# Patient Record
Sex: Female | Born: 1960 | Race: Black or African American | Hispanic: No | Marital: Married | Smoking: Current every day smoker
Health system: Southern US, Community
[De-identification: ages and names within clinical notes are randomized; demographics above are authoritative.]

## PROBLEM LIST (undated history)

## (undated) DIAGNOSIS — I219 Acute myocardial infarction, unspecified: Secondary | ICD-10-CM

## (undated) DIAGNOSIS — D649 Anemia, unspecified: Secondary | ICD-10-CM

---

## 2005-08-28 ENCOUNTER — Emergency Department: Payer: Self-pay | Admitting: Emergency Medicine

## 2007-12-03 ENCOUNTER — Emergency Department: Payer: Self-pay | Admitting: Emergency Medicine

## 2013-01-18 ENCOUNTER — Observation Stay: Payer: Self-pay | Admitting: Internal Medicine

## 2013-01-18 LAB — CBC
HCT: 16.6 % — ABNORMAL LOW (ref 35.0–47.0)
HGB: 4.4 g/dL — CL (ref 12.0–16.0)
HGB: 4.3 g/dL — CL (ref 12.0–16.0)
MCH: 14.7 pg — ABNORMAL LOW (ref 26.0–34.0)
MCH: 14.6 pg — ABNORMAL LOW (ref 26.0–34.0)
MCHC: 26.2 g/dL — ABNORMAL LOW (ref 32.0–36.0)
MCHC: 25.7 g/dL — ABNORMAL LOW (ref 32.0–36.0)
MCV: 57 fL — ABNORMAL LOW (ref 80–100)
RBC: 3 10*6/uL — ABNORMAL LOW (ref 3.80–5.20)
RBC: 2.92 10*6/uL — ABNORMAL LOW (ref 3.80–5.20)
WBC: 3.9 10*3/uL (ref 3.6–11.0)

## 2013-01-18 LAB — COMPREHENSIVE METABOLIC PANEL
Albumin: 3.4 g/dL (ref 3.4–5.0)
Alkaline Phosphatase: 65 U/L (ref 50–136)
Anion Gap: 5 — ABNORMAL LOW (ref 7–16)
BUN: 8 mg/dL (ref 7–18)
Calcium, Total: 8.4 mg/dL — ABNORMAL LOW (ref 8.5–10.1)
Chloride: 115 mmol/L — ABNORMAL HIGH (ref 98–107)
Creatinine: 0.55 mg/dL — ABNORMAL LOW (ref 0.60–1.30)
EGFR (Non-African Amer.): >60
Osmolality: 279 (ref 275–301)
Potassium: 3.3 mmol/L — ABNORMAL LOW (ref 3.5–5.1)
SGOT(AST): 18 U/L (ref 15–37)
SGPT (ALT): 9 U/L — ABNORMAL LOW (ref 12–78)
Sodium: 141 mmol/L (ref 136–145)

## 2013-01-18 LAB — HEMOGLOBIN: HGB: 7 g/dL — ABNORMAL LOW (ref 12.0–16.0)

## 2013-01-18 LAB — TROPONIN I: Troponin-I: <0.02 ng/mL

## 2014-03-25 IMAGING — CR DG CHEST 2V
1 series · 2 of 2 positions shown · non-contrast
Comparison: none

REASON FOR EXAM: SOB
COMMENTS:

[Series 1: pa · 0.17mm/px · 2 of 2 slices shown]
[im 1/2]
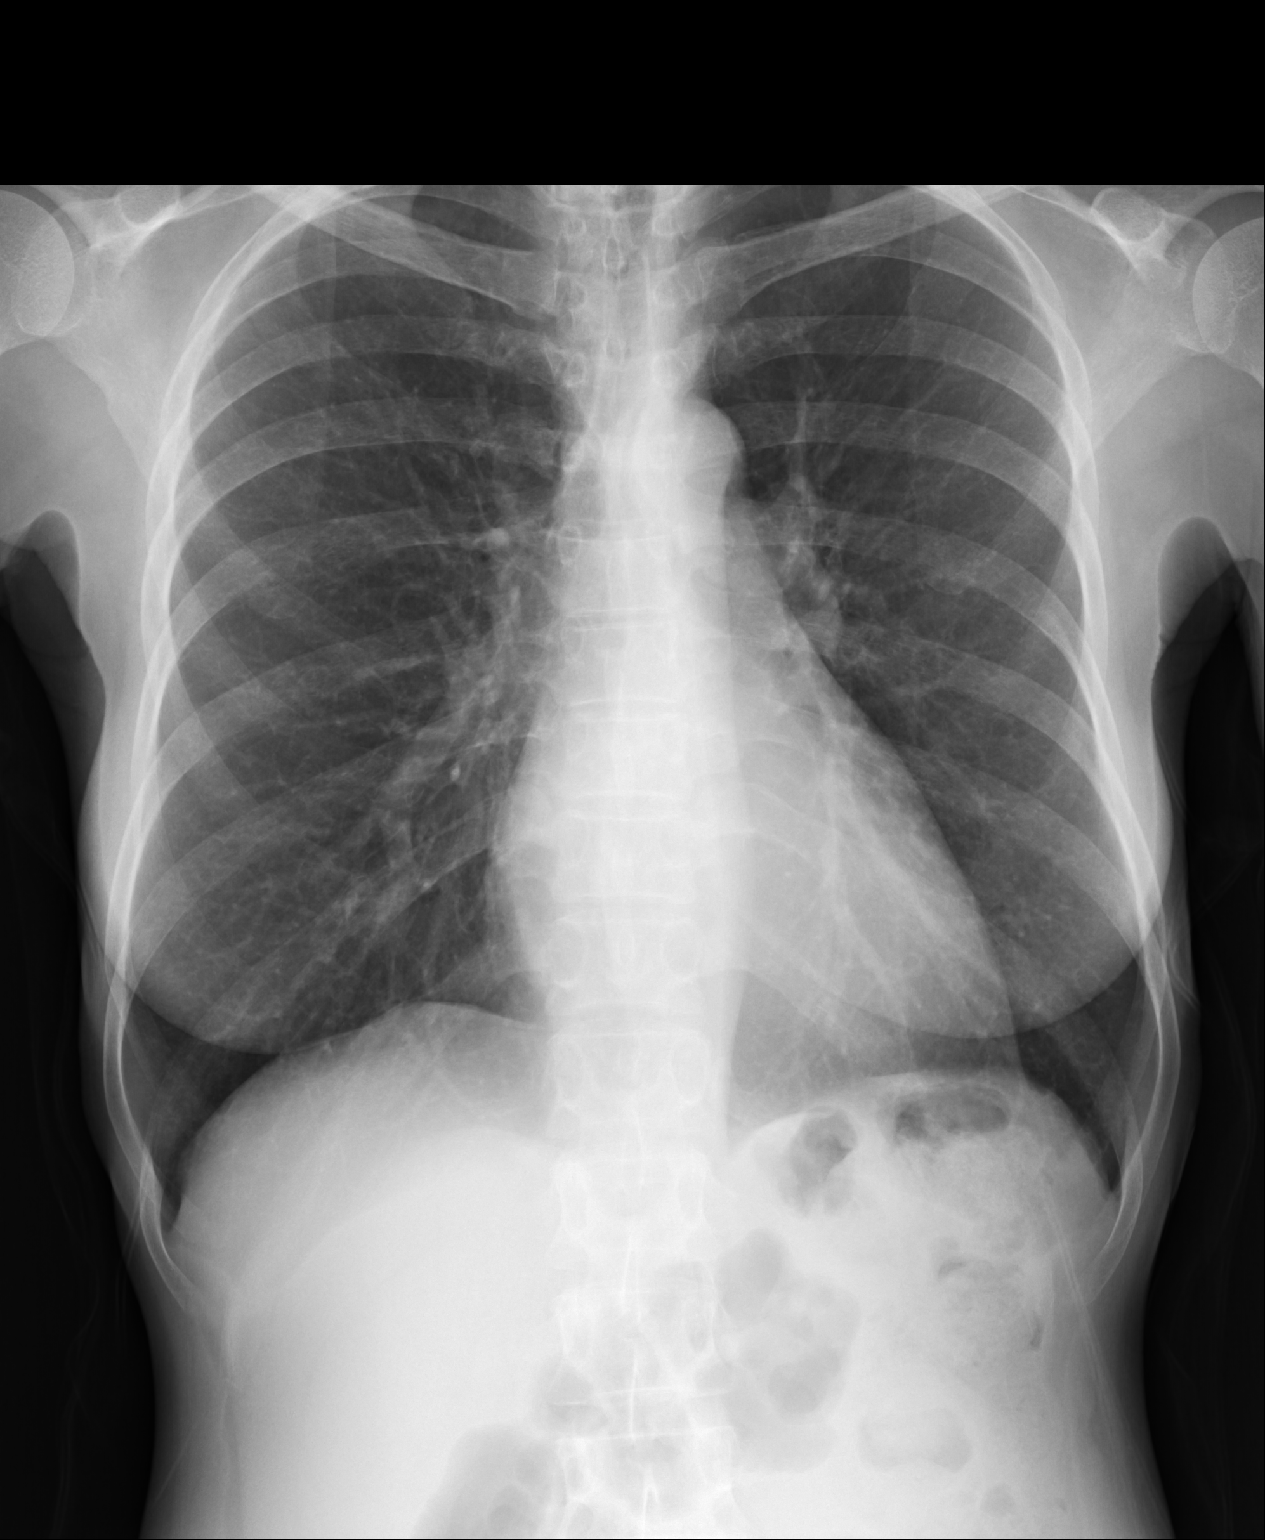
[im 2/2]
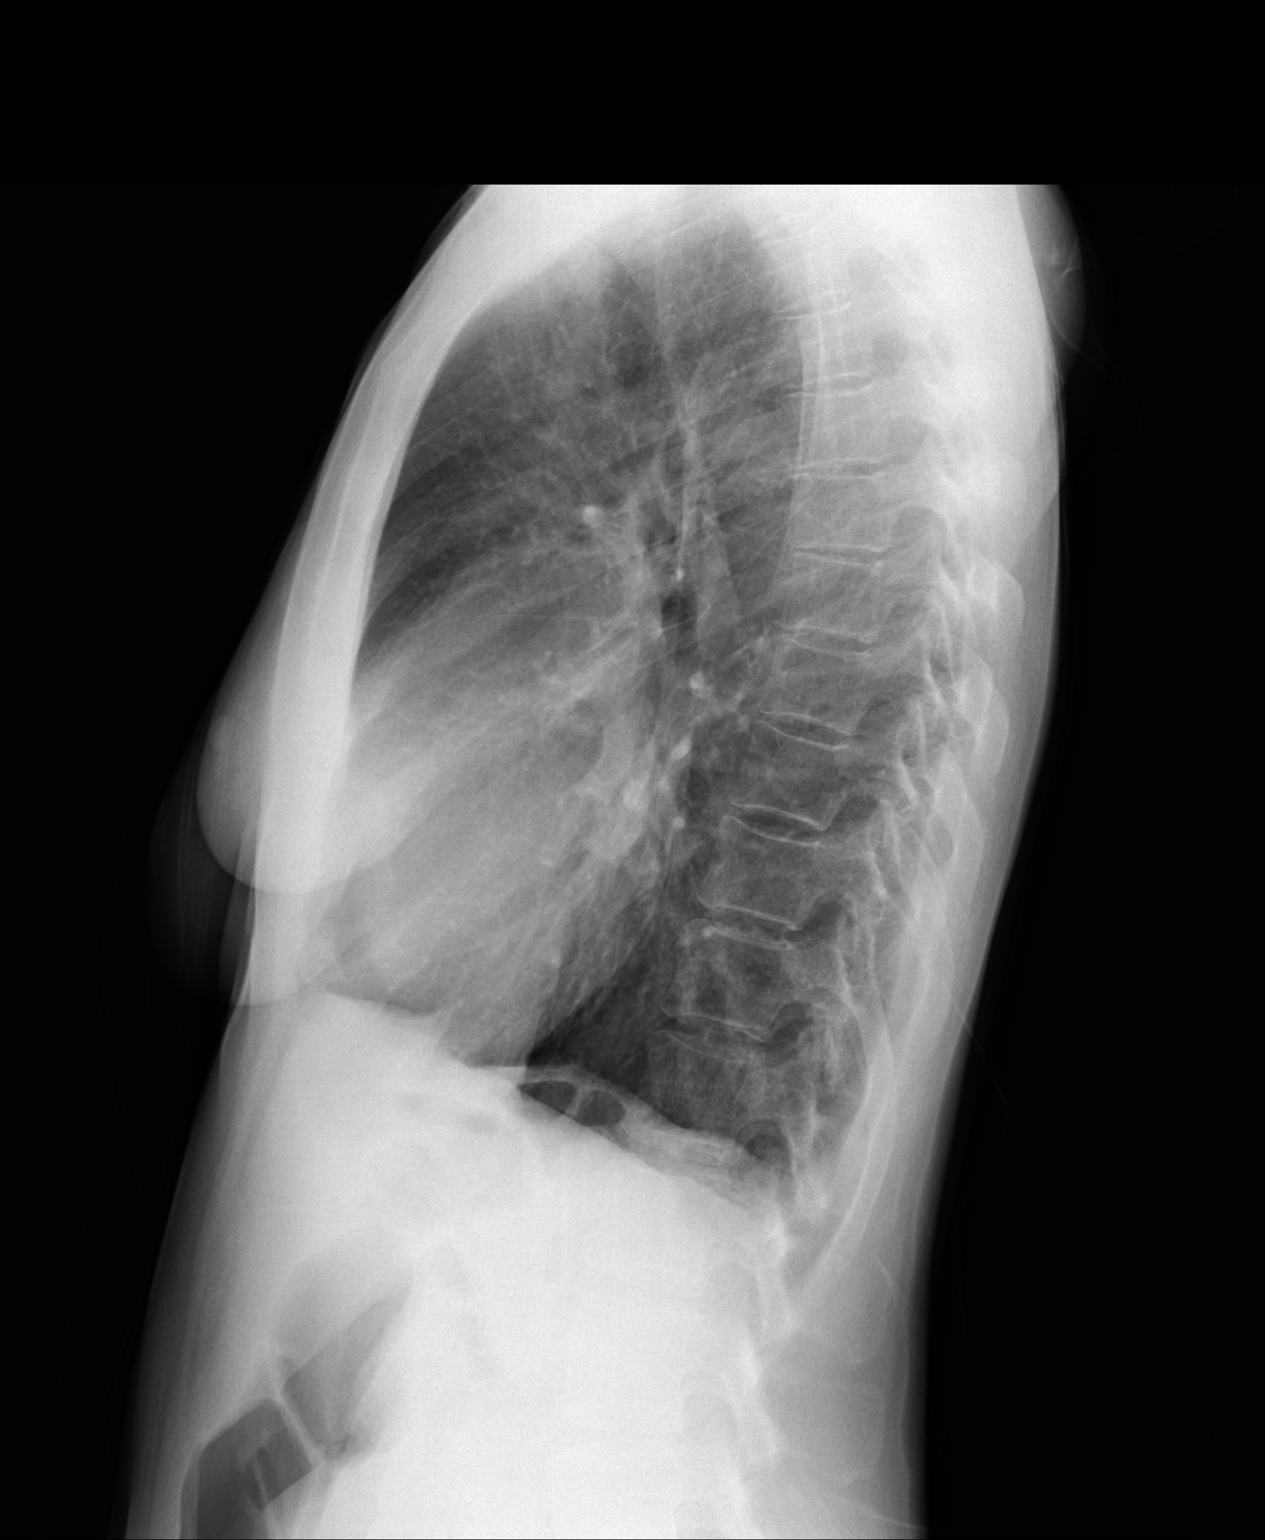

[2 of 2 positions shown; findings below may reference images not displayed]

PROCEDURE:     DXR - DXR CHEST PA (OR AP) AND LATERAL  - January 18, 2013  [DATE]

RESULT:     The lungs are mildly hyperinflated with mild hemidiaphragm
flattening. The cardiac silhouette is normal in size. The pulmonary
vascularity is not engorged. There is no pleural effusion or pneumothorax.
The mediastinum is normal in width. There is no mediastinal nor hilar
lymphadenopathy. The bony thorax is normal in appearance.
IMPRESSION: The findings suggest reactive airway disease or COPD. There
is no evidence of pneumonia nor CHF.

[REDACTED]

## 2014-08-29 NOTE — H&P (Signed)
PATIENT NAME:  Catherine Silva, [name] MR#:  [id number] DATE OF BIRTH:  1960/06/01  DATE OF ADMISSION:  01/18/2013  PRIMARY CARE PHYSICIAN: None. The patient [facility] to [facility], but she says she cannot afford to go there.   CHIEF COMPLAINT: Leg swelling.   HISTORY OF PRESENT ILLNESS: Catherine Silva is a 54 year old female with history of iron deficiency anemia and chronic back pain, who was on iron pills about a year ago, stopped taking it since her medications expired. She did not buy any over-the-counter iron pills and has been off iron since September 2013. She comes in because of leg swelling. She was found to have hemoglobin of 4 and is being admitted for further evaluation and management.   The patient states she was [facility] and they did colon[facility]. She does not know the results, but she states that everything looked okay. She does not remember getting an EGD. She denies taking any over-the-counter pain medicines including NSAIDS. She also denies any heavy menstrual cycles. Her last menstrual period was about two months ago.   PAST MEDICAL HISTORY: 1. Iron deficiency anemia with [facility]  2. Chronic back pain.   MEDICATIONS: None.   ALLERGIES: No known drug allergies.   REVIEW OF SYSTEMS: CONSTITUTIONAL: No fever, fatigue, weakness. EYES: No blurred or double vision or glaucoma.  ENT: No tinnitus, ear pain, hearing loss.  RESPIRATORY: No cough, wheeze, hemoptysis or dyspnea.  CARDIOVASCULAR: No chest pain, orthopnea. Positive for leg edema. No hypertension or palpitations.  GASTROINTESTINAL: No nausea, vomiting, diarrhea, melena, or GERD.   GENITOURINARY: No dysuria, hematuria.  ENDOCRINE: No polyuria or nocturia or thyroid problems.  HEMATOLOGY: No anemia or easy bruising.  SKIN: No acne or rash.  MUSCULOSKELETAL: No arthritis.  NEUROLOGIC: No CVA, TIA, dysarthria or vertigo.  PSYCHIATRIC: No  anxiety or depression. All other systems reviewed and negative.   PHYSICAL EXAMINATION: GENERAL: The patient is awake, alert, oriented x3, not in acute distress.  VITAL SIGNS: Afebrile, pulse is 105, respirations are 20 and blood pressure is 119/62.  HEENT: Atraumatic, normocephalic. PERRLA, EOM intact. Oral mucosa is moist.  Conjunctiva pallor present.  NECK: Supple. No JVD. No carotid bruit.  RESPIRATORY: Clear to auscultation bilaterally. No rales, rhonchi, respiratory distress or labored breathing.  HEART: Both the heart sounds are normal. Rate, rhythm regular. PMI is not lateralized. CHEST:  Nontender.  EXTREMITIES: Good pedal pulses, good femoral pulses 1+ pitting edema.  ABDOMINOUS: Soft, benign, nontender. No organomegaly.  NEUROLOGICAL:  Grossly intact cranial nerves II-XII. No motor or sensory deficit.  PSYCHIATRIC: The patient is awake, alert, oriented x3.  SKIN: Warm and dry.   CHEST X-RAY: Positive for COPD. H and H is 4.3 and 16.6, MCV is 57. White count is 3.9.  Creatinine 0.55, sodium is 141, potassium is 3.3, chloride is 115, bicarbonate is 21. Bilirubin is 0.1.   ASSESSMENT AND PLAN: A 54 year old [name] Catherine Silva comes in with leg swelling. was found to have hemoglobin of 4.4. She does have history of chronic iron deficiency anemia. She is being admitted for:  1. Acute on chronic iron deficiency anemia. The patient has [facility] the past. [facility] In the meantime, we will type and cross and transfuse 2 units of blood transfusion. The patient is agreeable, consented, risk and benefits of transfusion explained. The patient voiced understanding. We will check  hemoglobin after  transfusion is completed. The patient will be started on iron pills, of which she is recommended to buy over-the-counter. She is also recommended to follow up with hematology. The patient has financial constraints. She says she is not able to afford appointments  and will likely not be able to make it to hematology appointment. I did stress on the importance of her taking iron pills.  2. Chronic back pain. P.r.n. Tylenol as needed.  3. Deep venous thrombosis prophylaxis. The patient is ambulatory.  4. Record request has been sent to [facility]. We will review the records and we will order necessary tests after reviewing records.   TIME SPENT: 50 minutes.    ____________________________ Wylie Hail Allena Katz, MD sap:sg D: 01/18/2013 12:23:19 ET T: 01/18/2013 18:25:37 ET JOB#: [id number]  cc: Dorleen Kissel A. Allena Katz, MD, <Dictator> Willow Ora MD ELECTRONICALLY SIGNED 01/22/2013 8:06

## 2014-08-29 NOTE — Discharge Summary (Signed)
PATIENT NAME:  [name], Catherine A MR#:  [id number] DATE OF BIRTH:  1961-04-01  DATE OF ADMISSION:  01/18/2013 DATE OF DISCHARGE:  01/18/2013  PRIMARY CARE PHYSICIAN:  None.   CHIEF COMPLAINT:  Leg swelling.   DISCHARGE DIAGNOSES: 1.  Acute on chronic iron deficiency anemia, status post 2 units blood transfusion.  2.  Chronic iron deficiency anemia, suspect due to slow gastrointestinal bleed from chronic BC Powder use, possible gastritis.  The patient reluctant to get any gastroenterology work-up.  3.  Tobacco abuse.  4.  Back pain.   CODE STATUS:  FULL CODE.   MEDICATIONS:  Ferrous sulfate 325 mg by mouth twice daily.    The patient advised not to use BC powders.  The patient advised to stop smoking.   LABORATORY DATA:  Hemoglobin at discharge is 7.0.  Hemoglobin on admission is 4.3.  Hematocrit is 16.6, platelet count is 47.  White count is 3.3.  Chest x-ray is findings suggestive of reactive airway disease/COPD.     BRIEF SUMMARY OF HOSPITAL COURSE:  Catherine Silva is a 54 year old female with past medical history of iron deficiency anemia comes into the Emergency Room with increasing leg swelling.  She is admitted with:  1.  Acute on chronic iron deficiency anemia.  She came in with hemoglobin of 4.3.  The patient was admitted, got 2 units of blood transfusion.  She had a repeat hemoglobin is 7.0.  The patient has history of using BC Powders for back pain on a daily basis.  This could be slow GI bleed, rule out gastritis or peptic ulcer disease.  The patient was advised to stop BC Powders.  She was also recommended to get a GI work-up done while she is here, however she is reluctant and does not want to get any work-up done.  [facility] records were obtained from about a year ago.  She was admitted at the same time with similar presentation with low hemoglobin of 5.6.  She received 2 units of blood transfusion.  She was set up as outpatient to follow-up GI, however patient never followed up.  She  has known history of noncompliance.  2.  Tobacco abuse.  The patient was counseled on smoking cessation, about three minutes were spent.  3.  Chronic back pain.  As needed Tylenol was recommended to use.   Hospital stay otherwise remained stable.  The patient remained a FULL CODE.   TIME SPENT:  40 minutes.    ____________________________ Wylie HailSona A. Allena KatzPatel, MD sap:ea D: 01/18/2013 20:05:06 ET T: 01/19/2013 05:15:24 ET JOB#: [id number]  cc: Jack Mineau A. Allena KatzPatel, MD, <Dictator> Willow OraSONA A Tekisha Darcey MD ELECTRONICALLY SIGNED 01/22/2013 8:06

## 2015-01-11 ENCOUNTER — Emergency Department: Payer: Self-pay

## 2015-01-11 ENCOUNTER — Encounter: Payer: Self-pay | Admitting: *Deleted

## 2015-01-11 DIAGNOSIS — Z7982 Long term (current) use of aspirin: Secondary | ICD-10-CM | POA: Insufficient documentation

## 2015-01-11 DIAGNOSIS — Y998 Other external cause status: Secondary | ICD-10-CM | POA: Insufficient documentation

## 2015-01-11 DIAGNOSIS — Y9289 Other specified places as the place of occurrence of the external cause: Secondary | ICD-10-CM | POA: Insufficient documentation

## 2015-01-11 DIAGNOSIS — W1839XA Other fall on same level, initial encounter: Secondary | ICD-10-CM | POA: Insufficient documentation

## 2015-01-11 DIAGNOSIS — Z720 Tobacco use: Secondary | ICD-10-CM | POA: Insufficient documentation

## 2015-01-11 DIAGNOSIS — Y9389 Activity, other specified: Secondary | ICD-10-CM | POA: Insufficient documentation

## 2015-01-11 DIAGNOSIS — S20211A Contusion of right front wall of thorax, initial encounter: Secondary | ICD-10-CM | POA: Insufficient documentation

## 2015-01-11 LAB — CBC
HCT: 41.3 % (ref 35.0–47.0)
HEMOGLOBIN: 13.3 g/dL (ref 12.0–16.0)
MCH: 24.7 pg — AB (ref 26.0–34.0)
MCHC: 32.2 g/dL (ref 32.0–36.0)
MCV: 76.7 fL — ABNORMAL LOW (ref 80.0–100.0)
PLATELETS: 391 10*3/uL (ref 150–440)
RBC: 5.38 MIL/uL — AB (ref 3.80–5.20)
RDW: 19.6 % — ABNORMAL HIGH (ref 11.5–14.5)
WBC: 6.5 10*3/uL (ref 3.6–11.0)

## 2015-01-11 NOTE — ED Notes (Signed)
Pt c/o anterior R rib pain since falling on Monday onto her R side. Pt states worsening over time. Pain reproducible w/ palpation.

## 2015-01-12 ENCOUNTER — Emergency Department
Admission: EM | Admit: 2015-01-12 | Discharge: 2015-01-12 | Disposition: A | Discharge status: Home / Self Care | Payer: Self-pay | Attending: Emergency Medicine | Admitting: Emergency Medicine

## 2015-01-12 DIAGNOSIS — R0789 Other chest pain: Secondary | ICD-10-CM

## 2015-01-12 DIAGNOSIS — S20211A Contusion of right front wall of thorax, initial encounter: Secondary | ICD-10-CM

## 2015-01-12 DIAGNOSIS — R079 Chest pain, unspecified: Secondary | ICD-10-CM

## 2015-01-12 HISTORY — DX: Anemia, unspecified: D64.9

## 2015-01-12 HISTORY — DX: Acute myocardial infarction, unspecified: I21.9

## 2015-01-12 LAB — TROPONIN I

## 2015-01-12 LAB — BASIC METABOLIC PANEL
ANION GAP: 12 (ref 5–15)
BUN: 10 mg/dL (ref 6–20)
CALCIUM: 9.1 mg/dL (ref 8.9–10.3)
CHLORIDE: 109 mmol/L (ref 101–111)
CO2: 18 mmol/L — ABNORMAL LOW (ref 22–32)
CREATININE: 0.67 mg/dL (ref 0.44–1.00)
GFR calc non Af Amer: >60 mL/min (ref >60)
Glucose, Bld: 83 mg/dL (ref 65–99)
Potassium: 3.7 mmol/L (ref 3.5–5.1)
SODIUM: 139 mmol/L (ref 135–145)

## 2015-01-12 MED ORDER — HYDROCODONE-ACETAMINOPHEN 5-325 MG PO TABS: 1.0000 | ORAL_TABLET | Freq: Four times a day (QID) | ORAL | Status: AC | PRN

## 2015-01-12 MED ORDER — HYDROCODONE-ACETAMINOPHEN 5-325 MG PO TABS
1.0000 | ORAL_TABLET | Freq: Once | ORAL | Status: AC
  Administered 2015-01-12: 1 via ORAL
  Filled 2015-01-12: qty 1

## 2015-01-12 MED ORDER — IBUPROFEN 600 MG PO TABS
600.0000 mg | ORAL_TABLET | Freq: Once | ORAL | Status: AC
  Administered 2015-01-12: 600 mg via ORAL
  Filled 2015-01-12: qty 1

## 2015-01-12 MED ORDER — IBUPROFEN 600 MG PO TABS: 600.0000 mg | ORAL_TABLET | Freq: Three times a day (TID) | ORAL | Status: AC | PRN

## 2015-01-12 NOTE — Discharge Instructions (Signed)
1. Take pain medicines as needed (Motrin/Norco #15). 2. Return to the ED for worsening symptoms, fever, persistent vomiting, difficulty breathing or other concerns.  Chest Wall Pain Chest wall pain is pain in or around the bones and muscles of your chest. It may take up to 6 weeks to get better. It may take longer if you must stay physically active in your work and activities.  CAUSES  Chest wall pain may happen on its own. However, it may be caused by:  A viral illness like the flu.  Injury.  Coughing.  Exercise.  Arthritis.  Fibromyalgia.  Shingles. HOME CARE INSTRUCTIONS   Avoid overtiring physical activity. Try not to strain or perform activities that cause pain. This includes any activities using your chest or your abdominal and side muscles, especially if heavy weights are used.  Put ice on the sore area.  Put ice in a plastic bag.  Place a towel between your skin and the bag.  Leave the ice on for 15-20 minutes per hour while awake for the first 2 days.  Only take over-the-counter or prescription medicines for pain, discomfort, or fever as directed by your caregiver. SEEK IMMEDIATE MEDICAL CARE IF:   Your pain increases, or you are very uncomfortable.  You have a fever.  Your chest pain becomes worse.  You have new, unexplained symptoms.  You have nausea or vomiting.  You feel sweaty or lightheaded.  You have a cough with phlegm (sputum), or you cough up blood. MAKE SURE YOU:   Understand these instructions.  Will watch your condition.  Will get help right away if you are not doing well or get worse. Document Released: 04/25/2005 Document Revised: 07/18/2011 Document Reviewed: 12/20/2010 [facility] Patient Information 2015 [place], [place]. This information is not intended to replace advice given to you by your health care provider. Make sure you discuss any questions you have with your health care provider.  Rib Contusion A rib contusion (bruise) can  occur by a blow to the chest or by a fall against a hard object. Usually these will be much better in a couple weeks. If X-rays were taken today and there are no broken bones (fractures), the diagnosis of bruising is made. However, broken ribs may not show up for several days, or may be discovered later on a routine X-ray when signs of healing show up. If this happens to you, it does not mean that something was missed on the X-ray, but simply that it did not show up on the first X-rays. Earlier diagnosis will not usually change the treatment. HOME CARE INSTRUCTIONS   Avoid strenuous activity. Be careful during activities and avoid bumping the injured ribs. Activities that pull on the injured ribs and cause pain should be avoided, if possible.  For the first day or two, an ice pack used every 20 minutes while awake may be helpful. Put ice in a plastic bag and put a towel between the bag and the skin.  Eat a normal, well-balanced diet. Drink plenty of fluids to avoid constipation.  Take deep breaths several times a day to keep lungs free of infection. Try to cough several times a day. Splint the injured area with a pillow while coughing to ease pain. Coughing can help prevent pneumonia.  Wear a rib belt or binder only if told to do so by your caregiver. If you are wearing a rib belt or binder, you must do the breathing exercises as directed by your caregiver. If not used  properly, rib belts or binders restrict breathing which can lead to pneumonia.  Only take over-the-counter or prescription medicines for pain, discomfort, or fever as directed by your caregiver. SEEK MEDICAL CARE IF:   You or your child has an oral temperature above 102 F (38.9 C).  Your baby is older than 3 months with a rectal temperature of 100.5 F (38.1 C) or higher for more than 1 day.  You develop a cough, with thick or bloody sputum. SEEK IMMEDIATE MEDICAL CARE IF:   You have difficulty breathing.  You feel sick to  your stomach (nausea), have vomiting or belly (abdominal) pain.  You have worsening pain, not controlled with medications, or there is a change in the location of the pain.  You develop sweating or radiation of the pain into the arms, jaw or shoulders, or become light headed or faint.  You or your child has an oral temperature above 102 F (38.9 C), not controlled by medicine.  Your or your baby is older than 3 months with a rectal temperature of 102 F (38.9 C) or higher.  Your baby is 62 months old or younger with a rectal temperature of 100.4 F (38 C) or higher. MAKE SURE YOU:   Understand these instructions.  Will watch your condition.  Will get help right away if you are not doing well or get worse. Document Released: 01/18/2001 Document Revised: 08/20/2012 Document Reviewed: 12/12/2007 [facility] Patient Information 2015 [place], [place]. This information is not intended to replace advice given to you by your health care provider. Make sure you discuss any questions you have with your health care provider.

## 2015-01-12 NOTE — ED Notes (Signed)
Patient states that she was walking up her stairs and fell on her right side/flank approximately 2 weeks prior to the visit. Patient states that pain keep her from sleeping, and only hurts when she moves or coughs.

## 2015-01-12 NOTE — ED Provider Notes (Signed)
[facility] Emergency Department Provider Note  ____________________________________________  Time seen: Approximately 4:27 AM  I have reviewed the triage vital signs and the nursing notes.   HISTORY  Chief Complaint Chest Pain    HPI Catherine Silva is a 54 y.o. female who presents to the ED from home with a chief complaint of right rib pain. Patient had a mechanical fall approximately 2 weeks ago and struck her right anterior ribs on concrete. Complains of pain that keeps her from sleeping and is exacerbated by movement or coughing. Patient denies fever, chills, shortness of breath, abdominal pain, nausea, vomiting, diarrhea. She did not strike her head or experience LOC when she fell. Presents to the ED this evening for being unable to sleep secondary to discomfort.   Past Medical History  Diagnosis Date  . Myocardial infarction   . Anemia     There are no active problems to display for this patient.   History reviewed. No pertinent past surgical history.  Current Outpatient Rx  Name  Route  Sig  Dispense  Refill  . aspirin 81 MG tablet   Oral   Take 81 mg by mouth daily.           Allergies Review of patient's allergies indicates no known allergies.  History reviewed. No pertinent family history.  Social History Social History  Substance Use Topics  . Smoking status: Current Every Day Smoker    Types: Cigarettes  . Smokeless tobacco: Never Used  . Alcohol Use: Yes     Comment: every other weekend    Review of Systems Constitutional: No fever/chills Eyes: No visual changes. ENT: No sore throat. Cardiovascular: Positive for chest pain. Respiratory: Denies shortness of breath. Gastrointestinal: No abdominal pain.  No nausea, no vomiting.  No diarrhea.  No constipation. Genitourinary: Negative for dysuria. Musculoskeletal: Negative for back pain. Skin: Negative for rash. Neurological: Negative for headaches, focal weakness or  numbness.  10-point ROS otherwise negative.  ____________________________________________   PHYSICAL EXAM:  VITAL SIGNS: ED Triage Vitals  Enc Vitals Group     BP 01/11/15 2307 144/99 mmHg     Pulse Rate 01/11/15 2307 86     Resp 01/11/15 2307 20     Temp 01/11/15 2307 98.1 F (36.7 C)     Temp Source 01/11/15 2307 Oral     SpO2 01/11/15 2307 100 %     Weight 01/11/15 2307 112 lb 9.6 oz (51.075 kg)     Height 01/11/15 2307  (1.702 m)     Head Cir --      Peak Flow --      Pain Score 01/12/15 0341 9     Pain Loc --      Pain Edu? --      Excl. in GC? --     Constitutional: Alert and oriented. Well appearing and in no acute distress. Eyes: Conjunctivae are normal. PERRL. EOMI. Head: Atraumatic. Nose: No congestion/rhinnorhea. Mouth/Throat: Mucous membranes are moist.  Oropharynx non-erythematous. Neck: No stridor. No cervical spine tenderness to palpation. Cardiovascular: Normal rate, regular rhythm. Grossly normal heart sounds.  Good peripheral circulation. Respiratory: Normal respiratory effort.  No retractions. Lungs CTAB. Right anterior and lateral ribs beneath breast mildly tender to palpation. There is no ecchymosis. Gastrointestinal: Soft and nontender. Specifically, no tenderness to palpation in the right upper quadrant. No distention. No abdominal bruits. No CVA tenderness. Musculoskeletal: No lower extremity tenderness nor edema.  No joint effusions. Neurologic:  Normal speech  and language. No gross focal neurologic deficits are appreciated. No gait instability. Skin:  Skin is warm, dry and intact. No rash noted. Psychiatric: Mood and affect are normal. Speech and behavior are normal.  ____________________________________________   LABS (all labs ordered are listed, but only abnormal results are displayed)  Labs Reviewed  BASIC METABOLIC PANEL - Abnormal; Notable for the following:    CO2 18 (*)    All other components within normal limits  CBC -  Abnormal; Notable for the following:    RBC 5.38 (*)    MCV 76.7 (*)    MCH 24.7 (*)    RDW 19.6 (*)    All other components within normal limits  TROPONIN I   ____________________________________________  EKG  ED ECG REPORT I, SUNG,JADE J, the attending physician, personally viewed and interpreted this ECG.   Date: 01/12/2015  EKG Time: 2313  Rate: 74  Rhythm: normal EKG, normal sinus rhythm  Axis: Normal  Intervals:none  ST&T Change: Nonspecific  ____________________________________________  RADIOLOGY  Chest 2 view (viewed by me, interpreted per Dr. Andria Meuse):  No active cardiopulmonary disease. __________________________________________   PROCEDURES  Procedure(s) performed: None  Critical Care performed: No  ____________________________________________   INITIAL IMPRESSION / ASSESSMENT AND PLAN / ED COURSE  Pertinent labs & imaging results that were available during my care of the patient were reviewed by me and considered in my medical decision making (see chart for details).  54 year old female with right rib pain secondary to mechanical fall approximately 2 weeks ago. Nonspecific EKG with negative troponin. There is no pneumothorax seen on chest x-ray. Will treat with NSAID's, analgesia and patient will follow-up with her PCP. Strict return precautions given. Patient verbalizes understanding and agrees with plan of care. ____________________________________________   FINAL CLINICAL IMPRESSION(S) / ED DIAGNOSES  Final diagnoses:  Chest pain, unspecified chest pain type  Chest wall pain  Rib contusion, right, initial encounter      Irean Hong, MD 01/12/15 [id number]

## 2016-08-07 ENCOUNTER — Emergency Department
Admission: EM | Admit: 2016-08-07 | Discharge: 2016-08-07 | Disposition: A | Discharge status: Home / Self Care | Payer: Self-pay | Attending: Emergency Medicine | Admitting: Emergency Medicine

## 2016-08-07 ENCOUNTER — Encounter: Payer: Self-pay | Admitting: Emergency Medicine

## 2016-08-07 DIAGNOSIS — Z7982 Long term (current) use of aspirin: Secondary | ICD-10-CM | POA: Insufficient documentation

## 2016-08-07 DIAGNOSIS — M79601 Pain in right arm: Secondary | ICD-10-CM | POA: Insufficient documentation

## 2016-08-07 DIAGNOSIS — F17210 Nicotine dependence, cigarettes, uncomplicated: Secondary | ICD-10-CM | POA: Insufficient documentation

## 2016-08-07 DIAGNOSIS — Z791 Long term (current) use of non-steroidal anti-inflammatories (NSAID): Secondary | ICD-10-CM | POA: Insufficient documentation

## 2016-08-07 DIAGNOSIS — M5412 Radiculopathy, cervical region: Secondary | ICD-10-CM | POA: Insufficient documentation

## 2016-08-07 MED ORDER — PREDNISONE 5 MG/5ML PO SOLN: ORAL | 0 refills | Status: AC

## 2016-08-07 MED ORDER — METHYLPREDNISOLONE SODIUM SUCC 125 MG IJ SOLR
125.0000 mg | Freq: Once | INTRAMUSCULAR | Status: AC
  Administered 2016-08-07: 125 mg via INTRAMUSCULAR
  Filled 2016-08-07: qty 2

## 2016-08-07 NOTE — ED Provider Notes (Signed)
[facility] Emergency Department Provider Note  ____________________________________________  Time seen: Approximately 7:24 PM  I have reviewed the triage vital signs and the nursing notes.   HISTORY  Chief Complaint Hand Pain    HPI Catherine Silva is a 56 y.o. female presenting to the emergency department with cervical radiculopathy. Patient states that when she moves her neck to the left, she experiences pain and tingling that radiates down her right upper extremity. Patient denies falls or traumas. Patient denies prior surgeries to the right upper extremity or neck. Patient rates her discomfort at 3 out of 10 in intensity. Patient has not experienced right upper extremity avoidance. No alleviating measures have been attempted. Patient denies chest pain, chest tightness, shortness of breath, nausea, vomiting and abdominal pain.   Past Medical History:  Diagnosis Date  . Anemia   . Myocardial infarction     There are no active problems to display for this patient.   No past surgical history on file.  Prior to Admission medications   Medication Sig Start Date End Date Taking? Authorizing Provider  aspirin 81 MG tablet Take 81 mg by mouth daily.    Historical Provider, MD  HYDROcodone-acetaminophen (NORCO) 5-325 MG per tablet Take 1 tablet by mouth every 6 (six) hours as needed for moderate pain. 01/12/15   Irean Hong, MD  ibuprofen (ADVIL,MOTRIN) 600 MG tablet Take 1 tablet (600 mg total) by mouth every 8 (eight) hours as needed. 01/12/15   Irean Hong, MD  predniSONE 5 MG/5ML solution Take 12 mL on day one, take 10 mL on day 2, take 8 mL on day 3, take 6 mL on day 4, take 4 mL on day 5, take 2 mLs, on day 6. 08/07/16   Orvil Feil, PA-C    Allergies Patient has no known allergies.  No family history on file.  Social History Social History  Substance Use Topics  . Smoking status: Current Every Day Smoker    Types: Cigarettes  . Smokeless  tobacco: Never Used  . Alcohol use Yes     Comment: every other weekend    Review of Systems  Constitutional: No fever/chills Eyes: No visual changes. No discharge ENT: No upper respiratory complaints. Cardiovascular: no chest pain. Respiratory: no cough. No SOB. Musculoskeletal: Patient has right upper extremity pain Skin: Negative for rash, abrasions, lacerations, ecchymosis. Neurological: Patient has right upper extremity radiculopathy.  ____________________________________________   PHYSICAL EXAM:  VITAL SIGNS: ED Triage Vitals  Enc Vitals Group     BP 08/07/16 1738 (!) 141/93     Pulse Rate 08/07/16 1738 94     Resp 08/07/16 1738 16     Temp 08/07/16 1738 98 F (36.7 C)     Temp Source 08/07/16 1738 Oral     SpO2 08/07/16 1738 100 %     Weight 08/07/16 1739 110 lb (49.9 kg)     Height 08/07/16 1739  (1.702 m)     Head Circumference --      Peak Flow --      Pain Score 08/07/16 1738 3     Pain Loc --      Pain Edu? --      Excl. in GC? --      Constitutional: Alert and oriented. Well appearing and in no acute distress. Eyes: Conjunctivae are normal. PERRL. EOMI. Head: Atraumatic. Neck: Patient has full range of motion at the neck. Patient has reproducible symptoms with lateral rotation to  the left. Cardiovascular: Normal rate, regular rhythm. Normal S1 and S2.  Good peripheral circulation. Respiratory: Normal respiratory effort without tachypnea or retractions. Lungs CTAB. Good air entry to the bases with no decreased or absent breath sounds. Musculoskeletal:Patient has 5/5 strength in the upper and lower extremities bilaterally. Full range of motion at the shoulder, elbow and wrist bilaterally. Full range of motion at the hip, knee and ankle bilaterally. No changes in gait. Palpable radial and ulnar pulses bilaterally and symmetrically. Neurologic: Normal speech and language. Cranial nerves: 2-10 normal as tested. No sensory loss. Vision: No visual field  deficts noted to confrontation.  Speech: No dysarthria or expressive aphasia.  Psychiatric: Mood and affect are normal. Speech and behavior are normal. Patient exhibits appropriate insight and judgement.   ____________________________________________   LABS (all labs ordered are listed, but only abnormal results are displayed)  Labs Reviewed - No data to display ____________________________________________  EKG   ____________________________________________  RADIOLOGY   No results found.  ____________________________________________    PROCEDURES  Procedure(s) performed:    Procedures    Medications  methylPREDNISolone sodium succinate (SOLU-MEDROL) 125 mg/2 mL injection 125 mg (125 mg Intramuscular Given 08/07/16 1946)     ____________________________________________   INITIAL IMPRESSION / ASSESSMENT AND PLAN / ED COURSE  Pertinent labs & imaging results that were available during my care of the patient were reviewed by me and considered in my medical decision making (see chart for details).  Review of the [place] CSRS was performed in accordance of the [facility] prior to dispensing any controlled drugs.    Assessment and plan: Cervical radiculopathy Patient presents to the emergency department with cervical radiculopathy. Patient's symptoms were reproduced with range of motion testing at the neck. As patient performed lateral rotation to the left, patient experienced right upper extremity radiculopathy. Patient was given an injection of Solu-Medrol in the emergency department. She was discharged with tapered prednisone. A referral was made to neurosurgery, Dr. Marcell Barlow. Patient was advised to make an appointment if cervical radiculopathy persists. Physical exam and neurologic exam was reassuring. Vital signs are also reassuring at this time aside from hypertension. All patient questions were answered.     ____________________________________________  FINAL CLINICAL  IMPRESSION(S) / ED DIAGNOSES  Final diagnoses:  Cervical radiculopathy      NEW MEDICATIONS STARTED DURING THIS VISIT:  New Prescriptions   PREDNISONE 5 MG/5ML SOLUTION    Take 12 mL on day one, take 10 mL on day 2, take 8 mL on day 3, take 6 mL on day 4, take 4 mL on day 5, take 2 mLs, on day 6.        This chart was dictated using voice recognition software/Dragon. Despite best efforts to proofread, errors can occur which can change the meaning. Any change was purely unintentional.    Orvil Feil, PA-C 08/07/16 1950    Merrily Brittle, MD 08/07/16 2119

## 2016-08-07 NOTE — ED Notes (Signed)

## 2016-08-07 NOTE — ED Triage Notes (Signed)
Pt reports right hand swelling today, reports right hand pain x3 days, denies recent injury.

## 2017-09-06 ENCOUNTER — Ambulatory Visit: Payer: Self-pay | Attending: Oncology | Admitting: *Deleted

## 2017-09-06 ENCOUNTER — Ambulatory Visit
Admission: RE | Admit: 2017-09-06 | Discharge: 2017-09-06 | Disposition: A | Discharge status: Home / Self Care | Payer: Self-pay | Source: Ambulatory Visit | Attending: Oncology | Admitting: Oncology

## 2017-09-06 ENCOUNTER — Encounter (INDEPENDENT_AMBULATORY_CARE_PROVIDER_SITE_OTHER): Payer: Self-pay

## 2017-09-06 ENCOUNTER — Other Ambulatory Visit: Payer: Self-pay

## 2017-09-06 VITALS — BP 143/79 | HR 93 | Temp 97.6°F | Resp 12 | Ht 66.0 in | Wt 102.0 lb

## 2017-09-06 DIAGNOSIS — Z0000 Encounter for general adult medical examination without abnormal findings: Secondary | ICD-10-CM | POA: Insufficient documentation

## 2017-09-06 NOTE — Patient Instructions (Signed)
HPV Test The human papillomavirus (HPV) test is used to look for high-risk types of HPV infection. HPV is a group of about 100 viruses. Many of these viruses cause growths on, in, or around the genitals. Most HPV viruses cause infections that usually go away without treatment. However, HPV types 6, 11, 16, and 18 are considered high-risk types of HPV that can increase your risk of cancer of the cervix or anus if the infection is left untreated. An HPV test identifies the DNA (genetic) strands of the HPV infection, so it is also referred to as the HPV DNA test. Although HPV is found in both males and females, the HPV test is only used to screen for increased cancer risk in females:  With an abnormal Pap test.  After treatment of an abnormal Pap test.  Between the ages of 30 and 65.  After treatment of a high-risk HPV infection.  The HPV test may be done at the same time as a pelvic exam and Pap test in females over the age of 30. Both the HPV test and Pap test require a sample of cells from the cervix. How do I prepare for this test?  Do not douche or take a bath for 24-48 hours before the test or as directed by your health care provider.  Do not have sex for 24-48 hours before the test or as directed by your health care provider.  You may be asked to reschedule the test if you are menstruating.  You will be asked to urinate before the test. What do the results mean? It is your responsibility to obtain your test results. Ask the lab or department performing the test when and how you will get your results. Talk with your health care provider if you have any questions about your results. Your result will be negative or positive. Meaning of Negative Test Results A negative HPV test result means that no HPV was found, and it is very likely that you do not have HPV. Meaning of Positive Test Results A positive HPV test result indicates that you have HPV.  If your test result shows the presence  of any high-risk HPV strains, you may have an increased risk of developing cancer of the cervix or anus if the infection is left untreated.  If any low-risk HPV strains are found, you are not likely to have an increased risk of cancer.  Discuss your test results with your health care provider. He or she will use the results to make a diagnosis and determine a treatment plan that is right for you. Talk with your health care provider to discuss your results, treatment options, and if necessary, the need for more tests. Talk with your health care provider if you have any questions about your results. This information is not intended to replace advice given to you by your health care provider. Make sure you discuss any questions you have with your health care provider. Document Released: 05/20/2004 Document Revised: 12/30/2015 Document Reviewed: 09/10/2013 Elsevier Interactive Patient Education  2018 [employer]   Gave patient hand-out, Women Staying Healthy, Active and Well from [facility], with education on breast health, pap smears, heart and colon health.  

## 2017-09-06 NOTE — Progress Notes (Signed)
  Subjective:     Patient ID: Catherine Silva, female   DOB: [birth year], 57 y.o.   MRN: [id number]  HPI   Review of Systems     Objective:   Physical Exam  Pulmonary/Chest: Right breast exhibits no inverted nipple, no mass, no nipple discharge, no skin change and no tenderness. Left breast exhibits no inverted nipple, no mass, no nipple discharge, no skin change and no tenderness.  Abdominal: There is no splenomegaly or hepatomegaly.  Genitourinary: Pelvic exam was performed with patient supine. No labial fusion. There is no rash, tenderness, lesion or injury on the right labia. There is no rash, tenderness, lesion or injury on the left labia. Cervix exhibits no motion tenderness, no discharge and no friability. Right adnexum displays no mass, no tenderness and no fullness. Left adnexum displays no mass, no tenderness and no fullness. No erythema, tenderness or bleeding in the vagina. No foreign body in the vagina. No signs of injury around the vagina. Vaginal discharge found.    Genitourinary Comments: The cervix is deviated to the right - mildly odorous white discharge noted on vaginal exam  Skin: Rash noted.     Pruritic rash over the patients face, neck and extremities       Assessment:     57 year old Black female presents to [facility] for clinical breast exam, pap and mammogram.  Clinical breast exam unremarkable.  Taught self breast awareness.  Specimen collected for pap smear with some difficulty.  Patient is not sexually active and she was very uncomfortable during the exam.  The patient's face, neck and limbs are covered in an erythematous papule like rash that the patient states is pruritic.  States she saw her primary care provider who gave her some "cream and something for itching", but it has not helped.  States it's been present for over 2 weeks without change.  Denies any changes is soap, detergent, meds or anything she can think of.  Encouraged her to call back to her primary  care provider to be seen again, and maybe get a referral to a dermatologist.    Patient has been screened for eligibility.  She does not have any insurance, Medicare or Medicaid.  She also meets financial eligibility.  Hand-out given on the Affordable Care Act.  Plan:     Screening mammogram ordered.  Specimen for pap sent to the lab.  Will follow-up per [facility] protocol.

## 2017-09-10 LAB — PAP LB AND HPV HIGH-RISK
HPV, HIGH-RISK: NEGATIVE
PAP SMEAR COMMENT: 0

## 2017-09-11 ENCOUNTER — Telehealth: Payer: Self-pay | Admitting: *Deleted

## 2017-09-11 NOTE — Telephone Encounter (Signed)
Patient with Trichomonas noted on her pap smear.  Called patient to inform her of need for treatment, but a female answered the phone.  Left message for patient to return my call.

## 2017-09-14 ENCOUNTER — Encounter: Payer: Self-pay | Admitting: *Deleted

## 2017-09-14 NOTE — Progress Notes (Signed)
Called patient to inform her of her normal mammogram and pap results.  Reviewed that she had Trichomonas and needed to be treated.  Prescription for Metronidazole 2 grams by mouth once called to [facility] on [address].  She is to follow-up in one year with annual screening.

## 2021-10-07 DEATH — deceased
